# Patient Record
Sex: Female | Born: 1965 | Race: Black or African American | Hispanic: No | Smoking: Never smoker
Health system: Southern US, Community
[De-identification: ages and names within clinical notes are randomized; demographics above are authoritative.]

## PROBLEM LIST (undated history)

## (undated) DIAGNOSIS — I10 Essential (primary) hypertension: Secondary | ICD-10-CM

## (undated) DIAGNOSIS — H579 Unspecified disorder of eye and adnexa: Secondary | ICD-10-CM

## (undated) HISTORY — PX: ABDOMINAL HYSTERECTOMY: SHX81

## (undated) HISTORY — PX: FRACTURE SURGERY: SHX138

---

## 1998-10-15 ENCOUNTER — Emergency Department (HOSPITAL_COMMUNITY): Admission: EM | Admit: 1998-10-15 | Discharge: 1998-10-15 | Payer: Self-pay | Admitting: Emergency Medicine

## 1998-10-15 ENCOUNTER — Encounter: Payer: Self-pay | Admitting: Emergency Medicine

## 2005-04-03 ENCOUNTER — Emergency Department: Payer: Self-pay | Admitting: Emergency Medicine

## 2005-04-20 ENCOUNTER — Inpatient Hospital Stay: Payer: Self-pay | Admitting: Unknown Physician Specialty

## 2010-06-19 ENCOUNTER — Inpatient Hospital Stay: Payer: Self-pay | Admitting: Surgery

## 2010-06-22 LAB — CANCER ANTIGEN 19-9: CA 19-9: 14 U/mL (ref 0–35)

## 2012-11-09 ENCOUNTER — Inpatient Hospital Stay: Payer: Self-pay | Admitting: Orthopedic Surgery

## 2012-11-09 LAB — BASIC METABOLIC PANEL
Anion Gap: 5 — ABNORMAL LOW (ref 7–16)
BUN: 17 mg/dL (ref 7–18)
Calcium, Total: 9.2 mg/dL (ref 8.5–10.1)
Chloride: 104 mmol/L (ref 98–107)
Co2: 27 mmol/L (ref 21–32)
Creatinine: 0.7 mg/dL (ref 0.60–1.30)
EGFR (African American): >60
EGFR (Non-African Amer.): >60
Glucose: 107 mg/dL — ABNORMAL HIGH (ref 65–99)
Osmolality: 274 (ref 275–301)
Potassium: 3.8 mmol/L (ref 3.5–5.1)
Sodium: 136 mmol/L (ref 136–145)

## 2012-11-09 LAB — CBC WITH DIFFERENTIAL/PLATELET
Basophil #: 0 10*3/uL (ref 0.0–0.1)
Basophil %: 0.2 %
Eosinophil #: 0 10*3/uL (ref 0.0–0.7)
Eosinophil %: 0.3 %
HCT: 39.6 % (ref 35.0–47.0)
HGB: 13.2 g/dL (ref 12.0–16.0)
Lymphocyte #: 2.6 10*3/uL (ref 1.0–3.6)
Lymphocyte %: 31.2 %
MCH: 27.5 pg (ref 26.0–34.0)
MCHC: 33.4 g/dL (ref 32.0–36.0)
MCV: 83 fL (ref 80–100)
Monocyte #: 0.7 x10 3/mm (ref 0.2–0.9)
Monocyte %: 8.2 %
Neutrophil #: 5 10*3/uL (ref 1.4–6.5)
Neutrophil %: 60.1 %
Platelet: 282 10*3/uL (ref 150–440)
RBC: 4.8 10*6/uL (ref 3.80–5.20)
RDW: 14.2 % (ref 11.5–14.5)
WBC: 8.4 10*3/uL (ref 3.6–11.0)

## 2012-11-09 LAB — PROTIME-INR
INR: 1
Prothrombin Time: 13.5 secs (ref 11.5–14.7)

## 2012-11-09 LAB — APTT: Activated PTT: 25.4 secs (ref 23.6–35.9)

## 2012-11-10 LAB — URINALYSIS, COMPLETE
Bacteria: NONE SEEN
Bilirubin,UR: NEGATIVE
Blood: NEGATIVE
Glucose,UR: NEGATIVE mg/dL (ref 0–75)
Ketone: NEGATIVE
Leukocyte Esterase: NEGATIVE
Nitrite: NEGATIVE
Ph: 6 (ref 4.5–8.0)
Protein: NEGATIVE
RBC,UR: <1 /HPF (ref 0–5)
Specific Gravity: 1.015 (ref 1.003–1.030)
Squamous Epithelial: 1
WBC UR: <1 /HPF (ref 0–5)

## 2012-11-11 LAB — CBC WITH DIFFERENTIAL/PLATELET
Basophil #: 0.1 10*3/uL (ref 0.0–0.1)
Basophil %: 1.3 %
Eosinophil #: 0.1 10*3/uL (ref 0.0–0.7)
Eosinophil %: 1.4 %
HCT: 34.7 % — ABNORMAL LOW (ref 35.0–47.0)
HGB: 11.4 g/dL — ABNORMAL LOW (ref 12.0–16.0)
Lymphocyte #: 2.2 10*3/uL (ref 1.0–3.6)
Lymphocyte %: 24.7 %
MCH: 27.5 pg (ref 26.0–34.0)
MCHC: 33 g/dL (ref 32.0–36.0)
MCV: 83 fL (ref 80–100)
Monocyte #: 1 x10 3/mm — ABNORMAL HIGH (ref 0.2–0.9)
Monocyte %: 11.3 %
Neutrophil #: 5.4 10*3/uL (ref 1.4–6.5)
Neutrophil %: 61.3 %
Platelet: 243 10*3/uL (ref 150–440)
RBC: 4.16 10*6/uL (ref 3.80–5.20)
RDW: 14.1 % (ref 11.5–14.5)
WBC: 8.7 10*3/uL (ref 3.6–11.0)

## 2012-11-11 LAB — BASIC METABOLIC PANEL
Anion Gap: 4 — ABNORMAL LOW (ref 7–16)
BUN: 11 mg/dL (ref 7–18)
Calcium, Total: 8.4 mg/dL — ABNORMAL LOW (ref 8.5–10.1)
Chloride: 105 mmol/L (ref 98–107)
Co2: 27 mmol/L (ref 21–32)
Creatinine: 0.82 mg/dL (ref 0.60–1.30)
EGFR (African American): >60
EGFR (Non-African Amer.): >60
Glucose: 99 mg/dL (ref 65–99)
Osmolality: 271 (ref 275–301)
Potassium: 3.9 mmol/L (ref 3.5–5.1)
Sodium: 136 mmol/L (ref 136–145)

## 2012-11-14 LAB — URINE CULTURE

## 2012-11-17 LAB — CULTURE, BLOOD (SINGLE)

## 2012-11-28 ENCOUNTER — Ambulatory Visit: Payer: Self-pay | Admitting: Orthopedic Surgery

## 2014-06-21 NOTE — H&P (Signed)
   Subjective/Chief Complaint Right leg pain status post fall   History of Present Illness Patient is a 49 y/o female who fell at home.  She is unsure if she slipped or tripped.  She had immediate pain and was unable to stand after the injury.  [facility] by EMS.  She denies LOC or other injuries.   Past Med/Surgical Hx:  denies 04/03/05:   hysterectomy:   ALLERGIES:  No Known Allergies:   Family and Social History:  Family History Non-Contributory   Social History negative tobacco   Place of Living Home   Review of Systems:  Subjective/Chief Complaint Right leg pain   Physical Exam:  GEN no acute distress   HEENT PERRL, hearing intact to voice, moist oral mucosa, Oropharynx clear, good dentition   NECK supple  No masses  trachea midline   RESP normal resp effort  clear BS  no use of accessory muscles   CARD regular rate  murmur present   ABD denies tenderness  normal BS   LYMPH negative neck   EXTR Right leg compartments are soft and compressible.  Skin is intact.  She has palpable pedal pulses, intact sensation to light touch and intact motor function in the right lower extremity.  There is no angular or rotation deformity of the right leg.   SKIN normal to palpation   NEURO motor/sensory function intact   PSYCH A+O to time, place, person   Radiology Results: XRay:    11-Sep-14 20:00, Tibia And Fibula Right  Tibia And Fibula Right  REASON FOR EXAM:    injury  COMMENTS:       PROCEDURE: DXR - DXR TIBIA AND FIBULA RT (LOWER L  - Nov 09 2012  8:00PM     RESULT: AP and lateral views of the right tibia and fibula reveal a   comminuted spiral fracture of the junction of the middle and distal   thirds of the right tibia. There is a fracture through the fibular head.   The ankle joint mortise is grossly intact. The proximal tibia appears   intact.    IMPRESSION:  The patient has sustained a mildly displaced spiral fracture   of thejunction of the  middle and distal thirds of the diaphysis of the   right tibia. There is a fibular head fracture as well.   Dictation Site: 5        Verified By: DAVID A. SwazilandJORDAN, M.D., MD  LabUnknown:  PACS Image    Assessment/Admission Diagnosis Right tibia shaft fracture with proximal non-displaced fracture of the fibula.   Plan I shared with the patient her xrays and explained that she has a tibial shaft fracture.  I have recommended that she undergo intramedullary rod fixation of the right tibia fracture.  I am admitting her to my service.  She will be NPO after midnight and I am planning for surgery tomorrow around midday.   Electronic Signatures: Juanell FairlyKrasinski, Lakina Mcintire (MD)  (Signed 11-Sep-14 22:12)  Authored: CHIEF COMPLAINT and HISTORY, PAST MEDICAL/SURGIAL HISTORY, ALLERGIES, FAMILY AND SOCIAL HISTORY, REVIEW OF SYSTEMS, PHYSICAL EXAM, Radiology, ASSESSMENT AND PLAN   Last Updated: 11-Sep-14 22:12 by Juanell FairlyKrasinski, Jeffren Dombek (MD)

## 2014-06-21 NOTE — Discharge Summary (Signed)
PATIENT NAME:  Maria Hatfield, Maria Hatfield MR#:  [id number] DATE OF BIRTH:  1965/11/02  DATE OF ADMISSION:  11/09/2012 DATE OF DISCHARGE:  11/14/2012  REASON FOR ADMISSION: Right closed tibia and fibula fracture.   HISTORY OF PRESENT ILLNESS: Ms. Maria Hatfield presented on 11/09/2012 to the Emergency Department at [facility]. She had sustained a fall at home and was unable to bear weight following the injury. She denied other injuries or loss of consciousness. At the [facility] Department x-rays revealed that she has sustained a fracture of the distal shaft of the right tibia as well as an associated right proximal fibular fracture. The patient was admitted to the orthopedic surgery service for further evaluation and management.   HOSPITAL COURSE: The patient was admitted on 11/09/2012. She underwent intramedullary fixation of the right tibia fracture on 11/10/2012. This case was uncomplicated and the patient returns to the orthopedic surgery floor postoperatively. On postoperative day #1 the patient completed 24 hours of antibiotics and began physical therapy. Her Foley catheter remained in place. On postoperative day #2 the patient had improving pain and was able to ambulate to the door. Her splint and dressing remained dry. She was neurovascularly intact. The patient had stable vital signs. On postoperative day #3, the patient was stable but still having moderate pain in the right leg with slow progress with physical therapy. She had low-grade fevers and was encouraged to continue with incentive spirometry. A  cast was applied to her right lower leg and was well fitting. Before she could be discharged,  her pain will have to had to be improved on oral medications.   By postoperative day #5 the patient was doing much better in regards to her pain. She was continuing to work with physical and occupational therapy. She continued to elevate her right lower extremity. She was therefore ready  for discharge.   DISCHARGE INSTRUCTIONS: The patient will remain nonweightbearing on the right lower extremity with her cast on. She will continue using a walker for assistance with ambulation. She was instructed to continue strict elevation at home. The patient states that she will be unable to afford Lovenox and therefore is discharged on enteric-coated aspirin 325 mg b.i.d. for deep vein thrombosis prophylaxis. She will follow-up in my office in 10 to 14 days. The patient's motor and sensory function was intact. There was no pain with passive stretch of her toes. The patient will resume all home medications and was discharged on oxycodone 5 mg tablets with instructions to take 1 to 2 tabs every 4 to 6 hours p.r.n. for pain. She may take Tylenol intermittently and was also discharged on calcium with vitamin D 500/200 international units 1 tablet b.i.d. with meals. The patient understood these discharge plans.     ____________________________ Kathreen DevoidKevin Hatfield. Flo Berroa, MD klk:sg D: 12/03/2012 12:26:06 ET T: 12/03/2012 12:49:42 ET JOB#: [id number]  cc: Kathreen DevoidKevin Hatfield. Kason Benak, MD, <Dictator> Kathreen DevoidKEVIN Hatfield Lovelle Lema MD ELECTRONICALLY SIGNED 12/05/2012 12:25

## 2014-06-21 NOTE — Op Note (Signed)
PATIENT NAME:  Gardiner RamusCHAVIS, Crickett L MR#:  [id number] DATE OF BIRTH:  [birth year]  DATE OF PROCEDURE:  11/10/2012  PREOPERATIVE DIAGNOSIS: Right tibial shaft fracture.   POSTOPERATIVE DIAGNOSIS: Right tibial shaft fracture.   PROCEDURE: Intramedullary fixation of right tibial shaft fracture.   SURGEON: Juanell FairlyKevin Kaizen Ibsen, MD   ASSISTANT: Deeann SaintHoward Miller, MD  ANESTHESIA: General.   ESTIMATED BLOOD LOSS: 50 mL.   COMPLICATIONS: None.   INPLANTS:  Biomet Versanail 11x 330mm  Single oblique proximal locking screw and single distal interlocking screw  INDICATIONS FOR PROCEDURE: The patient is a 49 year old female who sustained a mechanical fall at home and sustained a spiral tibial shaft fracture with a nondisplaced proximal fibular fracture. Given her high activity level at baseline, the decision was made to fix the fracture with an intramedullary rod. I reviewed the risks and benefits of surgery with the patient prior to the procedure, and the patient has agreed to surgical fixation.   PROCEDURE NOTE: The patient had the right leg signed with the word "yes" according to the hospital's right site protocol. She was brought to the operating room where she was placed supine on the operative table. She underwent general endotracheal intubation. A tourniquet was applied to the right upper extremity. The patient's lower extremity was prepped and draped in a sterile fashion. A timeout was performed to verify the patient's name, date of birth, medical record number, correct site of surgery and correct procedure to be performed. It was also used to verify the patient had received antibiotics and that all appropriate instruments, implants and radiographic studies were available in the room. Once all in attendance were in agreement, the case began. An intraoperative C-arm was used to take images of the fracture. The patient's right leg was placed in a tibial distracting triangle. Slight traction on the fracture was applied  and the appropriate rotation was also applied to the right lower leg to allow for a near anatomic reduction.   A midline incision was made centered between the inferior pole of the patella and just medial to the tibial tubercle. Soft tissues were carefully dissected. Care was taken not to injure the patella tendon. A small medial arthrotomy was made which allowed for access to the anterior tibia. A guidepin was then inserted into the proximal tibia and its position was confirmed on AP and lateral C-arm images. Once the starting guidepin was in a good position, it was overdrilled to create the entry site for the nail. The entry hole was well centered on the anterior tibia and did not invade the tibial plateaus or tibial spines. A ball-tip guidewire was then inserted through the starter hole and advanced across the fracture site and into the distal tibia. Its position was confirmed on C-arm imaging. A depth gauge was then used to confirm the appropriate length for the nail. The depth was measured to 330 mm. Sequential reamers were then advanced over the ball-tip guidewire down the shaft of the tibia and across the fracture site into the distal tibia. Sequential reamers were used until a size 13 was advanced across the fracture site. This allowed for easy placement of an 11 x 330 mm Biomet VersaNail. Again, C-arm imaging was used to confirm adequate positioning of the nail. The nail was inserted to avoid any prominence of the nail proximally. The insertion guide arm was then positioned to allow for an oblique proximal locking screw. The drill sleeves were placed through the guide arm and a small incision was made in  the skin laterally to allow for the drill guide to approximate the anterolateral cortex of the tibia. The hole was drilled and the depth of this proximal locking screw was measured and then the appropriate screw was then inserted by hand. The patient had excellent bone quality and was felt that only a  single oblique screw was needed for proximal stability.   The attention was then turned to placing distal interlocking screw. Using a perfect circle technique, a single distal interlocking screw was placed from a medial to lateral direction using a freehand technique to create the hole and it was measured with a depth gauge. The distal interlocking screw was then placed by hand. Final images of the fracture were performed. A near anatomic position of the fracture was achieved. The wounds were then copiously irrigated. The stab incisions for the interlocking nails were closed with 2-0 Vicryl and staples. The anterior incision was closed with 0 Vicryl, 2-0 Vicryl and skin staples. Dry sterile dressings were applied along with an AO splint. The patient was then awoken and transferred to a hospital bed in stable condition. I was scrubbed and present for the entire case and all sharp and instrument counts were correct at the conclusion of the case. The patient did well with the case and was stable in the recovery room.  ____________________________ Kathreen Devoid, MD klk:sb D: 11/14/2012 08:29:00 ET T: 11/14/2012 08:57:35 ET JOB#: [id number]  cc: Kathreen Devoid, MD, <Dictator> Kathreen Devoid MD ELECTRONICALLY SIGNED 11/16/2012 12:30

## 2015-06-20 ENCOUNTER — Other Ambulatory Visit: Payer: Self-pay | Admitting: Ophthalmology

## 2015-06-20 DIAGNOSIS — H209 Unspecified iridocyclitis: Secondary | ICD-10-CM

## 2015-06-26 ENCOUNTER — Ambulatory Visit
Admission: RE | Admit: 2015-06-26 | Discharge: 2015-06-26 | Disposition: A | Discharge status: Home / Self Care | Payer: BC Managed Care – PPO | Source: Ambulatory Visit | Attending: Ophthalmology | Admitting: Ophthalmology

## 2015-06-26 ENCOUNTER — Other Ambulatory Visit
Admission: RE | Admit: 2015-06-26 | Discharge: 2015-06-26 | Disposition: A | Discharge status: Home / Self Care | Payer: BC Managed Care – PPO | Source: Ambulatory Visit | Attending: Ophthalmology | Admitting: Ophthalmology

## 2015-06-26 DIAGNOSIS — H209 Unspecified iridocyclitis: Secondary | ICD-10-CM

## 2015-06-27 LAB — MISC LABCORP TEST (SEND OUT): LABCORP TEST NAME: 80713

## 2015-06-27 LAB — FLUORESCENT TREPONEMAL AB(FTA)-IGG-BLD: Fluorescent Treponemal Ab, IgG: NONREACTIVE

## 2015-07-02 LAB — QUANTIFERON TB GOLD ASSAY (BLOOD)

## 2015-07-02 LAB — QUANTIFERON IN TUBE
QFT TB AG MINUS NIL VALUE: 0.09 [IU]/mL
QUANTIFERON MITOGEN VALUE: >10 IU/mL
QUANTIFERON TB AG VALUE: 0.22 [IU]/mL
QUANTIFERON TB GOLD: NEGATIVE
Quantiferon Nil Value: 0.13 IU/mL

## 2015-07-02 LAB — ANGIOTENSIN CONVERTING ENZYME: ANGIOTENSIN-CONVERTING ENZYME: 45 U/L (ref 14–82)

## 2015-07-02 LAB — ANA W/REFLEX IF POSITIVE: ANA: NEGATIVE

## 2017-03-24 ENCOUNTER — Ambulatory Visit (INDEPENDENT_AMBULATORY_CARE_PROVIDER_SITE_OTHER): Payer: BC Managed Care – PPO

## 2017-03-24 ENCOUNTER — Ambulatory Visit
Admission: EM | Admit: 2017-03-24 | Discharge: 2017-03-24 | Disposition: A | Discharge status: Home / Self Care | Payer: BC Managed Care – PPO | Attending: Family Medicine | Admitting: Family Medicine

## 2017-03-24 ENCOUNTER — Other Ambulatory Visit: Payer: Self-pay

## 2017-03-24 ENCOUNTER — Encounter: Payer: Self-pay | Admitting: *Deleted

## 2017-03-24 DIAGNOSIS — W19XXXA Unspecified fall, initial encounter: Secondary | ICD-10-CM

## 2017-03-24 DIAGNOSIS — M79645 Pain in left finger(s): Secondary | ICD-10-CM

## 2017-03-24 DIAGNOSIS — M25561 Pain in right knee: Secondary | ICD-10-CM | POA: Diagnosis not present

## 2017-03-24 HISTORY — DX: Unspecified disorder of eye and adnexa: H57.9

## 2017-03-24 HISTORY — DX: Essential (primary) hypertension: I10

## 2017-03-24 MED ORDER — TRAMADOL HCL 50 MG PO TABS: 50.0000 mg | ORAL_TABLET | Freq: Three times a day (TID) | ORAL | 0 refills | Status: AC | PRN

## 2017-03-24 NOTE — ED Triage Notes (Signed)
PAtient fell this AM injuring her right knee and left thumb. Patient has a history of right tib fib fracture repair.

## 2017-03-24 NOTE — Discharge Instructions (Signed)
Tylenol 1000 mg three times daily.  Rest, Ice, Elevation.  Use the medication if you desire.  Take care  Dr. Adriana Simasook

## 2017-03-24 NOTE — ED Provider Notes (Addendum)
[facility]   CSN: [id number] Arrival date & time: 03/24/17  0807  History   Chief Complaint Chief Complaint  Patient presents with  . Knee Pain   HPI  52 year old female presents with right knee pain and left thumb pain after suffering a fall this morning.  Patient reports that she slipped and fell forward onto concrete.  She injured her right knee and left thumb.  She states her pain is currently mild, 4-5/10 in severity.  Worse with range of motion.  She is iced the areas without significant improvement.  She reports mild swelling of the thumb.  Patient is particularly concerned that she had prior repair for fracture of her tibia and has hardware.  No other associated symptoms.  No other complaints or concerns at this time.  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 Authorizing Provider  brimonidine (ALPHAGAN) 0.2 % ophthalmic solution Place 1 drop into both eyes 2 (two) times daily.   Yes [provider]  dorzolamide-timolol (COSOPT) 22.3-6.8 MG/ML ophthalmic solution Place 1 drop into both eyes 2 (two) times daily.   Yes [provider]  folic acid (FOLVITE) 1 MG tablet Take 1 mg by mouth daily.   Yes [provider]  methotrexate (RHEUMATREX) 7.5 MG tablet Take 75 mg by mouth once a week. Caution" Chemotherapy. Protect from light.   Yes [provider]  Olmesartan-Amlodipine-HCTZ 40-5-25 MG TABS Take by mouth daily.   Yes [provider]  traMADol (ULTRAM) 50 MG tablet Take 1 tablet (50 mg total) by mouth every 8 (eight) hours as needed. 03/24/17   Tommie Samsook, Dekisha Mesmer G, DO   Family History Diabetes type II Father    Hypertension Father    Diabetes type II Mother     Social  History Social History   Tobacco Use  . Smoking status: Never Smoker  . Smokeless tobacco: Never Used  Substance Use Topics  . Alcohol use: Yes    Frequency: Never  . Drug use: No   Allergies   Patient has no known allergies.  Review of Systems Review of Systems  Musculoskeletal:       R knee pain. L thumb pain.  All other systems reviewed and are negative.  Physical Exam Triage Vital Signs ED Triage Vitals  Enc Vitals Group     BP 03/24/17 0822 121/85     Pulse Rate 03/24/17 0822 90     Resp 03/24/17 0822 16     Temp 03/24/17 0822 98.8 F (37.1 C)     Temp Source 03/24/17 0822 Oral     SpO2 03/24/17 0822 99 %     Weight 03/24/17 0824 250 lb (113.4 kg)     Height 03/24/17 0824 5' 2.5" (1.588 m)     Head Circumference --      Peak Flow --      Pain Score 03/24/17 0824 4     Pain Loc --      Pain Edu? --      Excl. in GC? --    Updated Vital Signs BP 121/85 (BP Location: Left Arm)   Pulse 90   Temp 98.8 F (37.1 C) (Oral)   Resp 16   Ht 5' 2.5" (1.588 m)  Wt 250 lb (113.4 kg)   SpO2 99%   BMI 45.00 kg/m    Physical Exam  Constitutional: She is oriented to person, place, and time. She appears well-developed. No distress.  HENT:  Head: Normocephalic and atraumatic.  Nose: Nose normal.  Eyes: Conjunctivae are normal. No scleral icterus.  Cardiovascular: Normal rate and regular rhythm.  Murmur heard. Pulmonary/Chest: Effort normal and breath sounds normal. She has no wheezes. She has no rales.  Musculoskeletal:  Right knee -ligaments intact.  No effusion.  Patient has tenderness at the tibial tuberosity.  Full range of motion.  Left thumb -no appreciable swelling.  Full range of motion.  She does have some tenderness of the proximal phalanx..  Neurological: She is alert and oriented to person, place, and time.  Skin: Skin is warm. No rash noted.  Psychiatric: She has a normal mood and affect. Her behavior is normal.  Nursing note and vitals  reviewed.  UC Treatments / Results  Labs (all labs ordered are listed, but only abnormal results are displayed) Labs Reviewed - No data to display  EKG  EKG Interpretation None      Radiology Dg Knee Complete 4 Views Right  Result Date: 03/24/2017 CLINICAL DATA:  Fall.  Right knee pain EXAM: RIGHT KNEE - COMPLETE 4+ VIEW COMPARISON:  11/28/2012 FINDINGS: Intramedullary rod noted in the visualized right tibia. No acute bony abnormality. Specifically, no fracture, subluxation, or dislocation. Joint spaces maintained. No joint effusion. IMPRESSION: No acute bony abnormality. Electronically Signed   By: Charlett Nose M.D.   On: 03/24/2017 09:46   Dg Finger Thumb Left  Result Date: 03/24/2017 CLINICAL DATA:  Fall on outstretched hand.  Thumb pain EXAM: LEFT THUMB 2+V COMPARISON:  None. FINDINGS: There is no evidence of fracture or dislocation. There is no evidence of arthropathy or other focal bone abnormality. Soft tissues are unremarkable IMPRESSION: Negative. Electronically Signed   By: Charlett Nose M.D.   On: 03/24/2017 09:47    Procedures Procedures (including critical care time)  Medications Ordered in UC Medications - No data to display   Initial Impression / Assessment and Plan / UC Course  I have reviewed the triage vital signs and the nursing notes.  Pertinent labs & imaging results that were available during my care of the patient were reviewed by me and considered in my medical decision making (see chart for details).    52 year old female presents with knee pain and thumb pain after a fall.  X-ray negative.  Advised Tylenol, rest, ice, elevation.  Tramadol if needed.  Work note given.  Final Clinical Impressions(s) / UC Diagnoses   Final diagnoses:  Acute pain of right knee  Pain of left thumb    ED Discharge Orders        Ordered    traMADol (ULTRAM) 50 MG tablet  Every 8 hours PRN     03/24/17 1001     Controlled Substance Prescriptions [place] Controlled  Substance Registry consulted? No   Tommie Sams, [place] 03/24/17 1006

## 2018-06-20 IMAGING — CR DG KNEE COMPLETE 4+V*R*
4 series · 4 of 4 positions shown · non-contrast
Comparison: 11/28/2012

CLINICAL DATA: Fall.  Right knee pain

EXAM:
RIGHT KNEE - COMPLETE 4+ VIEW

[knee ap]
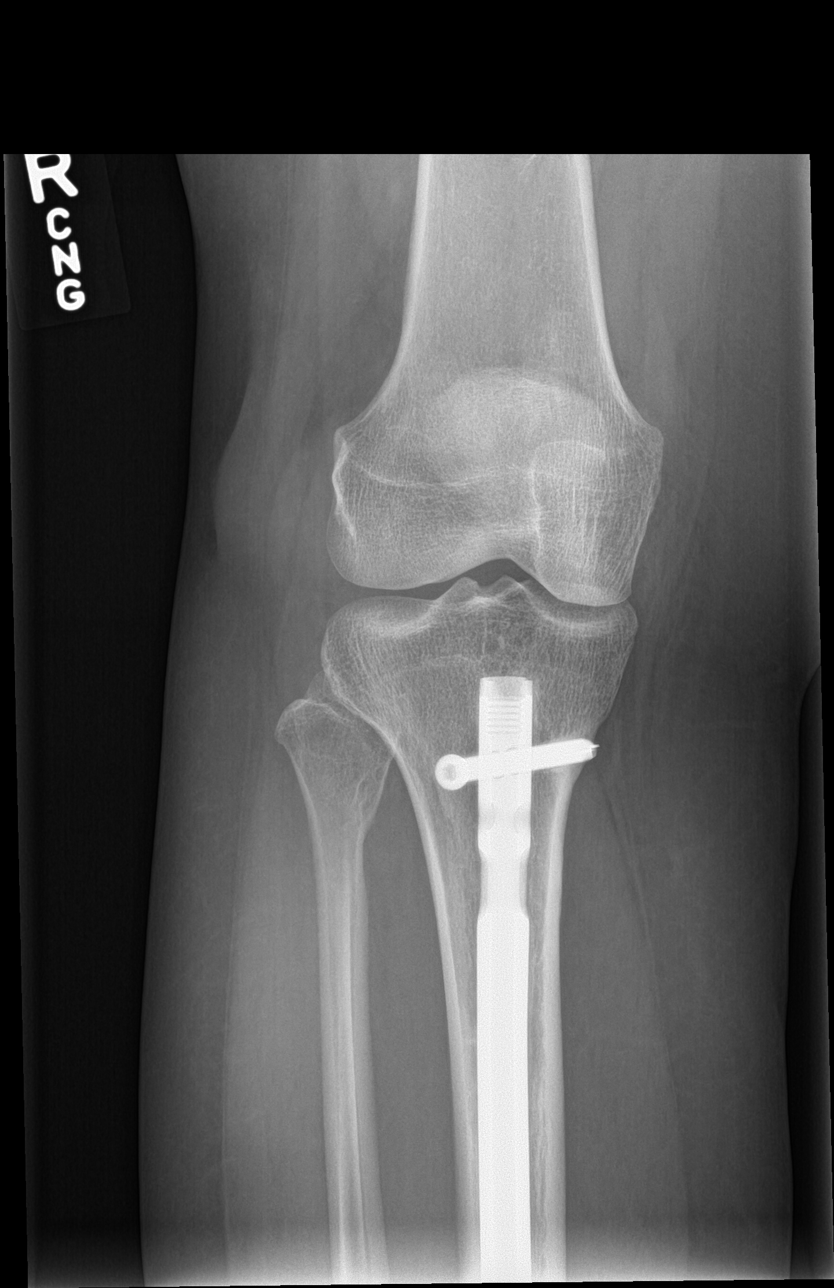

[knee lat]
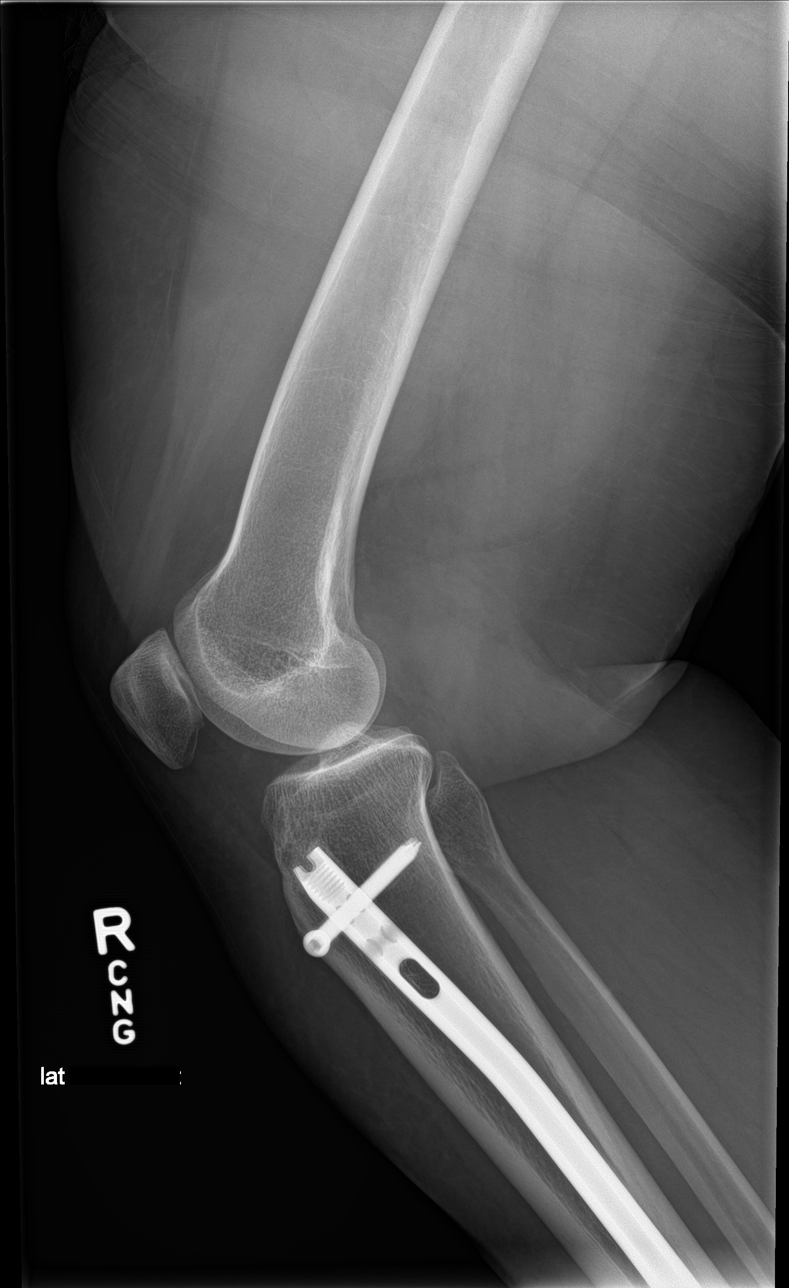

[tunnel]
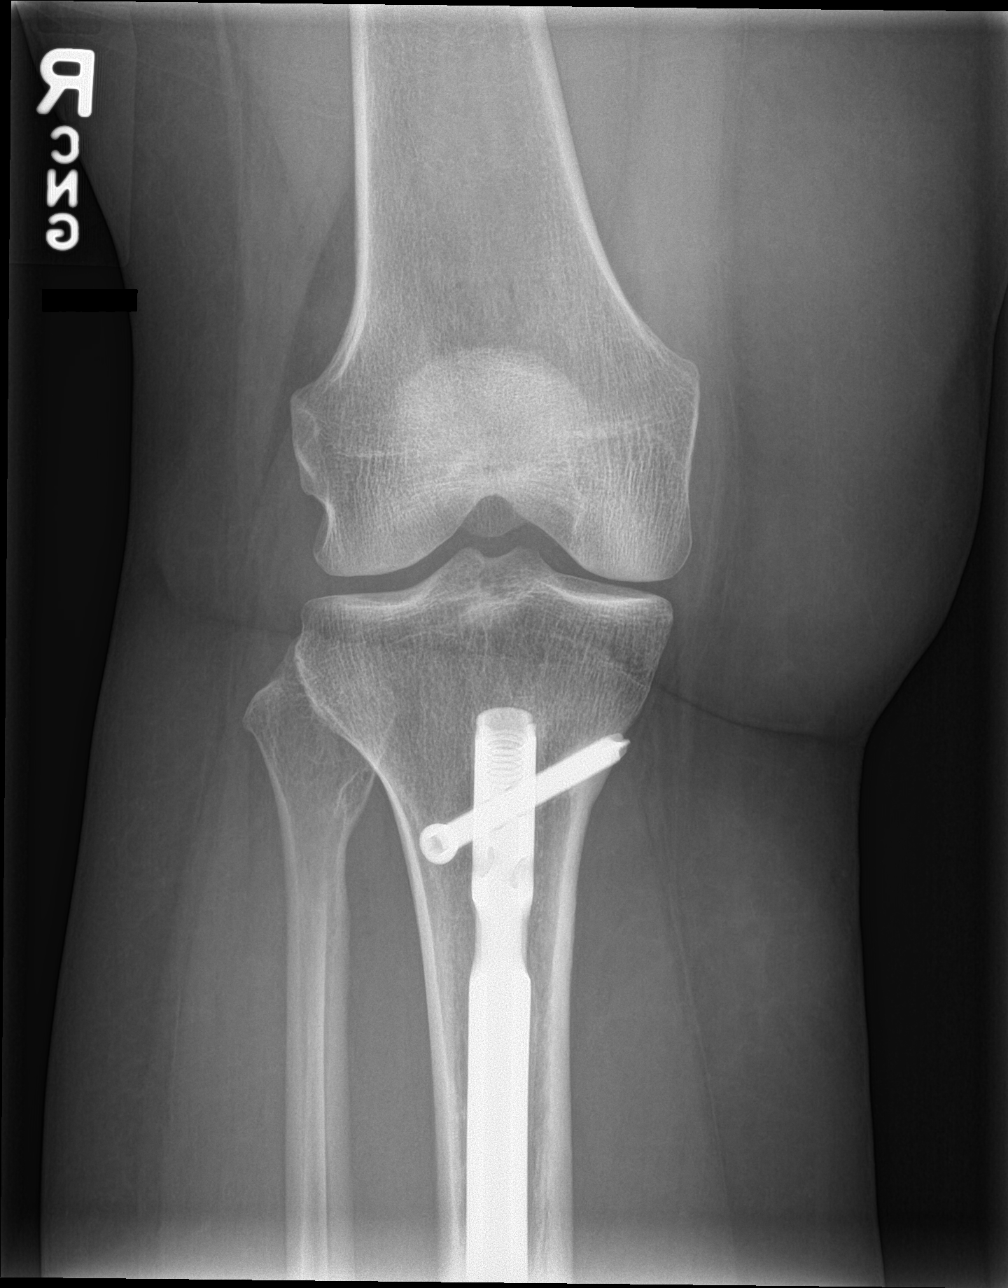

[patella skyline]
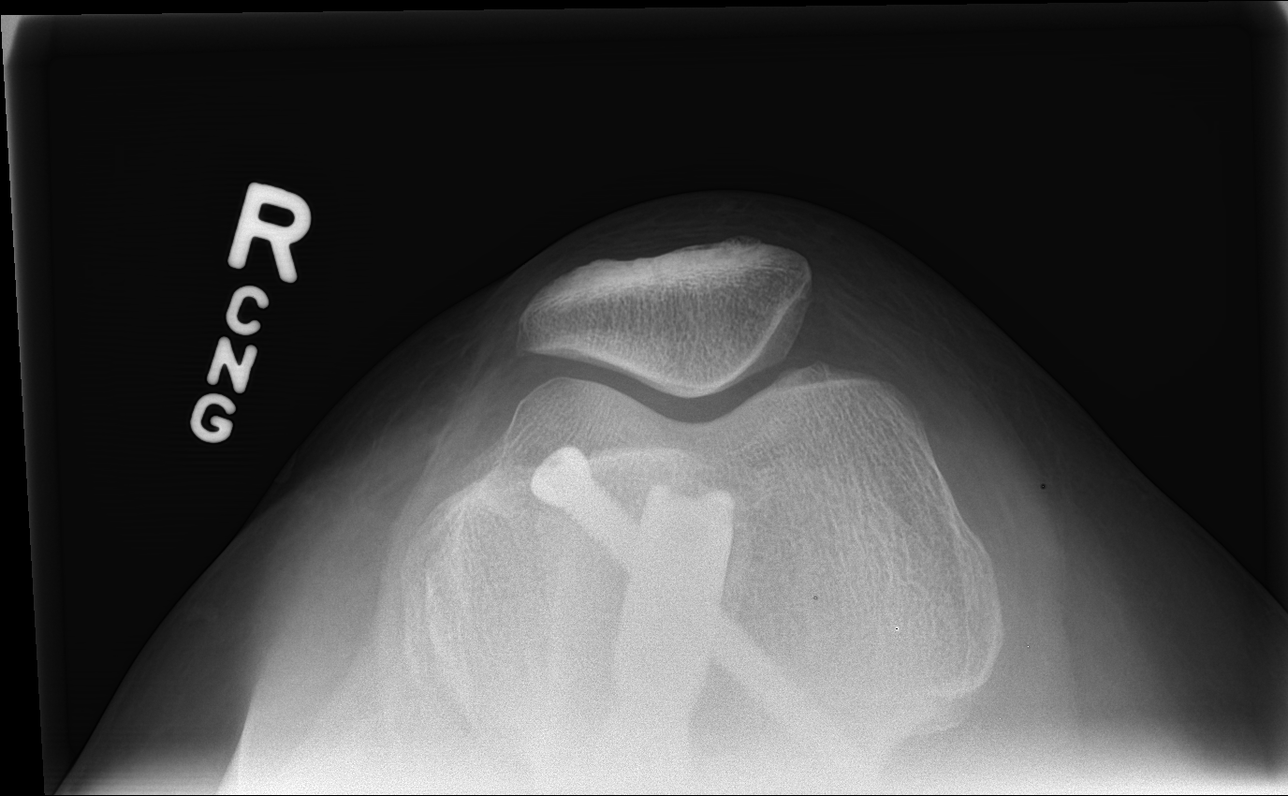

[4 of 4 positions shown; findings below may reference images not displayed]

FINDINGS: Intramedullary rod noted in the visualized right tibia. No acute
bony abnormality. Specifically, no fracture, subluxation, or
dislocation. Joint spaces maintained. No joint effusion.
IMPRESSION: No acute bony abnormality.

## 2021-01-13 ENCOUNTER — Other Ambulatory Visit (HOSPITAL_COMMUNITY)
Admission: RE | Admit: 2021-01-13 | Discharge: 2021-01-13 | Disposition: A | Discharge status: Home / Self Care | Payer: BC Managed Care – PPO | Source: Ambulatory Visit | Attending: Ophthalmology | Admitting: Ophthalmology

## 2021-01-13 DIAGNOSIS — Z79899 Other long term (current) drug therapy: Secondary | ICD-10-CM | POA: Insufficient documentation

## 2021-01-13 DIAGNOSIS — H209 Unspecified iridocyclitis: Secondary | ICD-10-CM | POA: Insufficient documentation

## 2021-01-13 LAB — COMPREHENSIVE METABOLIC PANEL
ALT: 15 U/L (ref 0–44)
AST: 20 U/L (ref 15–41)
Albumin: 3.8 g/dL (ref 3.5–5.0)
Alkaline Phosphatase: 123 U/L (ref 38–126)
Anion gap: 8 (ref 5–15)
BUN: 11 mg/dL (ref 6–20)
CO2: 26 mmol/L (ref 22–32)
Calcium: 9.1 mg/dL (ref 8.9–10.3)
Chloride: 102 mmol/L (ref 98–111)
Creatinine, Ser: 0.74 mg/dL (ref 0.44–1.00)
GFR, Estimated: >60 mL/min (ref >60)
Glucose, Bld: 186 mg/dL — ABNORMAL HIGH (ref 70–99)
Potassium: 3.6 mmol/L (ref 3.5–5.1)
Sodium: 136 mmol/L (ref 135–145)
Total Bilirubin: 0.4 mg/dL (ref 0.3–1.2)
Total Protein: 7.1 g/dL (ref 6.5–8.1)

## 2021-01-13 LAB — CBC WITH DIFFERENTIAL/PLATELET
Abs Immature Granulocytes: 0.03 10*3/uL (ref 0.00–0.07)
Basophils Absolute: 0.1 10*3/uL (ref 0.0–0.1)
Basophils Relative: 2 %
Eosinophils Absolute: 0.1 10*3/uL (ref 0.0–0.5)
Eosinophils Relative: 1 %
HCT: 43.4 % (ref 36.0–46.0)
Hemoglobin: 14.1 g/dL (ref 12.0–15.0)
Immature Granulocytes: 0 %
Lymphocytes Relative: 42 %
Lymphs Abs: 3 10*3/uL (ref 0.7–4.0)
MCH: 27.5 pg (ref 26.0–34.0)
MCHC: 32.5 g/dL (ref 30.0–36.0)
MCV: 84.6 fL (ref 80.0–100.0)
Monocytes Absolute: 0.6 10*3/uL (ref 0.1–1.0)
Monocytes Relative: 8 %
Neutro Abs: 3.4 10*3/uL (ref 1.7–7.7)
Neutrophils Relative %: 47 %
Platelets: 346 10*3/uL (ref 150–400)
RBC: 5.13 MIL/uL — ABNORMAL HIGH (ref 3.87–5.11)
RDW: 14.6 % (ref 11.5–15.5)
WBC: 7.2 10*3/uL (ref 4.0–10.5)
nRBC: 0 % (ref 0.0–0.2)

## 2021-09-26 ENCOUNTER — Ambulatory Visit
Admission: EM | Admit: 2021-09-26 | Discharge: 2021-09-26 | Disposition: A | Discharge status: Home / Self Care | Payer: BC Managed Care – PPO | Attending: Emergency Medicine | Admitting: Emergency Medicine

## 2021-09-26 DIAGNOSIS — H00021 Hordeolum internum right upper eyelid: Secondary | ICD-10-CM | POA: Diagnosis not present

## 2021-09-26 DIAGNOSIS — I10 Essential (primary) hypertension: Secondary | ICD-10-CM | POA: Diagnosis not present

## 2021-09-26 MED ORDER — ERYTHROMYCIN 5 MG/GM OP OINT: TOPICAL_OINTMENT | OPHTHALMIC | 0 refills | Status: AC

## 2021-09-26 MED ORDER — OLMESARTAN-AMLODIPINE-HCTZ 40-5-25 MG PO TABS: 1.0000 | ORAL_TABLET | Freq: Every day | ORAL | 1 refills | Status: AC

## 2021-09-26 NOTE — ED Triage Notes (Signed)
Patient presents to [facility] for right eye irritation.   Patient reports pain and swelling in the right eye.  Patient states the pain started Wednesday and the swelling started yesterday.

## 2021-09-26 NOTE — Discharge Instructions (Signed)
Continue to apply warm compresses.  Perform eyelid hygiene with baby shampoo in a rich lather and vigorous scrubbing of your eyelids twice daily. Rinse thoroughly with water.  Apply Erythromycin ointment to the eyelid margin with a clean Q-tip twice daily, and a 1/2 inch ribbon to the inside of the lower lid. Blink several times to liquify the ointment and spread it ober the inside of your eyelids.  If your symptoms do not improve, or you develop changes in your vision follow up with your eye doctor.    Resume your blood pressure medication as prescribed and follow-up with your PCP.  If you develop a headache, changes in vision, dizziness, weakness, or balance issues call 911 and go to the ER>

## 2021-09-26 NOTE — ED Provider Notes (Signed)
[facility]    CSN: [id number] Arrival date & time: 09/26/21  1542      History   Chief Complaint Chief Complaint  Patient presents with   Eye Problem    Right    HPI Maria Hatfield is a 56 y.o. female.   HPI  56 year old female here for evaluation of right eyelid swelling.  Patient reports that for the last 3 days she has been experiencing swelling and discomfort to the right upper eyelid.  She states that she has had some tearing from the eye but no drainage.  It does feel irritated when she blinks.  Patient has a history of high blood pressure but states that she has not taken her high blood pressure medication in years.  Her blood pressure in clinic is currently 165/125.  She denies any chest pain, headache, shortness of breath, or dizziness.  She also denies changes in vision.  Past Medical History:  Diagnosis Date   Eye problems    Hypertension     There are no problems to display for this patient.   Past Surgical History:  Procedure Laterality Date   ABDOMINAL HYSTERECTOMY     FRACTURE SURGERY      OB History   No obstetric history on file.      Home Medications    Prior to Admission medications   Medication Sig Start Date End Date Taking? Authorizing Provider  erythromycin ophthalmic ointment Place a 1/2 inch ribbon of ointment into the lower eyelid twice daily. 09/26/21  Yes Maria Augusta, NP  folic acid (FOLVITE) 1 MG tablet Take 1 mg by mouth daily.   Yes [provider]  traMADol (ULTRAM) 50 MG tablet Take 1 tablet (50 mg total) by mouth every 8 (eight) hours as needed. 03/24/17  Yes Cook, Jayce G, DO  brimonidine (ALPHAGAN) 0.2 % ophthalmic solution Place 1 drop into both eyes 2 (two) times daily.    [provider]  dorzolamide-timolol (COSOPT) 22.3-6.8 MG/ML ophthalmic solution Place 1 drop into both eyes 2 (two) times daily.    [provider]  methotrexate (RHEUMATREX) 7.5 MG tablet Take 75 mg by mouth once a  week. Caution" Chemotherapy. Protect from light.    [provider]  Olmesartan-amLODIPine-HCTZ 40-5-25 MG TABS Take 1 tablet by mouth daily. 09/26/21   Maria Augusta, NP    Family History Family History  Problem Relation Age of Onset   Hypertension Mother    Diabetes Mother    Hypertension Father    Diabetes Father     Social History Social History   Tobacco Use   Smoking status: Never   Smokeless tobacco: Never  Vaping Use   Vaping Use: Never used  Substance Use Topics   Alcohol use: Yes   Drug use: No     Allergies   Patient has no known allergies.   Review of Systems Review of Systems  Constitutional:  Negative for fever.  Eyes:  Positive for discharge. Negative for photophobia, pain, redness, itching and visual disturbance.  Respiratory:  Negative for shortness of breath.   Cardiovascular:  Negative for chest pain.  Neurological:  Negative for dizziness and headaches.  Hematological: Negative.   Psychiatric/Behavioral: Negative.       Physical Exam Triage Vital Signs ED Triage Vitals  Enc Vitals Group     BP 09/26/21 1553 (S) (!) 165/125     Pulse Rate 09/26/21 1553 98     Resp --  Temp 09/26/21 1553 98.8 F (37.1 C)     Temp Source 09/26/21 1553 Oral     SpO2 09/26/21 1553 98 %     Weight 09/26/21 1550 250 lb (113.4 kg)     Height 09/26/21 1550 5' 2.5" (1.588 m)     Head Circumference --      Peak Flow --      Pain Score 09/26/21 1550 6     Pain Loc --      Pain Edu? --      Excl. in GC? --    No data found.  Updated Vital Signs BP (S) (!) 165/125 (BP Location: Left Arm) Comment: Provider made aware  Pulse 98   Temp 98.8 F (37.1 C) (Oral)   Ht 5' 2.5" (1.588 m)   Wt 250 lb (113.4 kg)   SpO2 98%   BMI 45.00 kg/m   Visual Acuity Right Eye Distance: 20/40 uncorrected Left Eye Distance: 20/25 uncorrected Bilateral Distance: 20/25 uncorrected  Right Eye Near:   Left Eye Near:    Bilateral Near:     Physical Exam Vitals  and nursing note reviewed.  Constitutional:      Appearance: Normal appearance. She is not ill-appearing.  HENT:     Head: Normocephalic and atraumatic.  Eyes:     General: No scleral icterus.       Right eye: No discharge.        Left eye: No discharge.     Extraocular Movements: Extraocular movements intact.     Conjunctiva/sclera: Conjunctivae normal.     Pupils: Pupils are equal, round, and reactive to light.  Cardiovascular:     Rate and Rhythm: Normal rate and regular rhythm.     Pulses: Normal pulses.     Heart sounds: Normal heart sounds. No murmur heard.    No friction rub. No gallop.  Pulmonary:     Effort: Pulmonary effort is normal.     Breath sounds: Normal breath sounds. No wheezing, rhonchi or rales.  Skin:    General: Skin is warm and dry.     Capillary Refill: Capillary refill takes less than 2 seconds.     Findings: No erythema or rash.  Neurological:     General: No focal deficit present.     Mental Status: She is alert and oriented to person, place, and time.  Psychiatric:        Mood and Affect: Mood normal.        Behavior: Behavior normal.        Thought Content: Thought content normal.        Judgment: Judgment normal.      UC Treatments / Results  Labs (all labs ordered are listed, but only abnormal results are displayed) Labs Reviewed - No data to display  EKG   Radiology No results found.  Procedures Procedures (including critical care time)  Medications Ordered in UC Medications - No data to display  Initial Impression / Assessment and Plan / UC Course  I have reviewed the triage vital signs and the nursing notes.  Pertinent labs & imaging results that were available during my care of the patient were reviewed by me and considered in my medical decision making (see chart for details).  Patient is a very pleasant, nontoxic-appearing 57 year old female here for evaluation of right eye irritation as outlined in HPI above.  On exam  patient has a mildly edematous right upper eyelid.  There is no erythema or induration.  Along the proximal aspect of the upper eyelid margin there is a hordeolum that has ruptured and is draining a yellow pus.  The bulbar and labral conjunctiva are unremarkable.  Her pupils equal round reactive and EOMs intact.  Cardiopulmonary exam reveals S1-S2 heart sounds with regular rate and rhythm and lung sounds are clear auscultation all fields.  Patient exam is consistent with a hordeolum internum.  I will place her on erythromycin ointment twice daily for treatment of her stye and I have encouraged her to continue to apply warm compresses to help the stye continues to express itself.  As far as her blood pressure goes, I will refill the patient's blood pressure medication and she indicates that she will restart it.  She is not having any neurologic symptoms at present but I did caution her that if she develops any headache, chest pain, shortness of breath, dizziness, or weakness.  Also any balance issues.  She should go to the emergency department.  I have also instructed her to make a follow-up appointment with her PCP to discuss her blood pressure and for continued medication management.   Final Clinical Impressions(s) / UC Diagnoses   Final diagnoses:  Hordeolum internum of right upper eyelid  Essential hypertension     Discharge Instructions      Continue to apply warm compresses.  Perform eyelid hygiene with baby shampoo in a rich lather and vigorous scrubbing of your eyelids twice daily. Rinse thoroughly with water.  Apply Erythromycin ointment to the eyelid margin with a clean Q-tip twice daily, and a 1/2 inch ribbon to the inside of the lower lid. Blink several times to liquify the ointment and spread it ober the inside of your eyelids.  If your symptoms do not improve, or you develop changes in your vision follow up with your eye doctor.    Resume your blood pressure medication as  prescribed and follow-up with your PCP.  If you develop a headache, changes in vision, dizziness, weakness, or balance issues call 911 and go to the ER>      ED Prescriptions     Medication Sig Dispense Auth. Provider   Olmesartan-amLODIPine-HCTZ 40-5-25 MG TABS Take 1 tablet by mouth daily. 30 tablet Maria Augusta, NP   erythromycin ophthalmic ointment Place a 1/2 inch ribbon of ointment into the lower eyelid twice daily. 3.5 g Maria Augusta, NP      PDMP not reviewed this encounter.   Maria Augusta, NP 09/26/21 [phone]
